# Patient Record
Sex: Male | Born: 2000
Health system: Southern US, Community
[De-identification: ages and names within clinical notes are randomized; demographics above are authoritative.]

## PROBLEM LIST (undated history)

## (undated) DIAGNOSIS — F988 Other specified behavioral and emotional disorders with onset usually occurring in childhood and adolescence: Secondary | ICD-10-CM

## (undated) HISTORY — PX: WISDOM TOOTH EXTRACTION: SHX21

---
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
## 2022-01-11 DIAGNOSIS — F900 Attention-deficit hyperactivity disorder, predominantly inattentive type: Secondary | ICD-10-CM | POA: Diagnosis not present

## 2022-04-05 DIAGNOSIS — J101 Influenza due to other identified influenza virus with other respiratory manifestations: Secondary | ICD-10-CM | POA: Diagnosis not present

## 2022-04-05 DIAGNOSIS — R0981 Nasal congestion: Secondary | ICD-10-CM | POA: Diagnosis not present

## 2022-04-05 DIAGNOSIS — R6883 Chills (without fever): Secondary | ICD-10-CM | POA: Diagnosis not present

## 2022-04-05 DIAGNOSIS — R059 Cough, unspecified: Secondary | ICD-10-CM | POA: Diagnosis not present

## 2022-05-12 DIAGNOSIS — Z7182 Exercise counseling: Secondary | ICD-10-CM | POA: Diagnosis not present

## 2022-05-12 DIAGNOSIS — S62658D Nondisplaced fracture of medial phalanx of other finger, subsequent encounter for fracture with routine healing: Secondary | ICD-10-CM | POA: Diagnosis not present

## 2022-05-12 DIAGNOSIS — Z68.41 Body mass index (BMI) pediatric, 5th percentile to less than 85th percentile for age: Secondary | ICD-10-CM | POA: Diagnosis not present

## 2022-05-28 DIAGNOSIS — M25641 Stiffness of right hand, not elsewhere classified: Secondary | ICD-10-CM | POA: Diagnosis not present

## 2022-05-28 DIAGNOSIS — M79644 Pain in right finger(s): Secondary | ICD-10-CM | POA: Diagnosis not present

## 2022-07-20 DIAGNOSIS — M79644 Pain in right finger(s): Secondary | ICD-10-CM | POA: Diagnosis not present

## 2023-04-07 ENCOUNTER — Other Ambulatory Visit (HOSPITAL_COMMUNITY): Payer: Self-pay

## 2023-04-07 MED ORDER — TRIAMCINOLONE ACETONIDE 0.1 % EX OINT
TOPICAL_OINTMENT | CUTANEOUS | 2 refills | Status: AC
  Filled 2023-04-07: qty 30, 15d supply, fill #0

## 2023-04-07 MED ORDER — HYDROCORTISONE 2.5 % EX OINT
1.0000 | TOPICAL_OINTMENT | Freq: Two times a day (BID) | CUTANEOUS | 2 refills | Status: AC
  Filled 2023-04-07: qty 28.35, 15d supply, fill #0

## 2023-08-24 ENCOUNTER — Other Ambulatory Visit (HOSPITAL_COMMUNITY): Payer: Self-pay

## 2023-08-24 ENCOUNTER — Ambulatory Visit (INDEPENDENT_AMBULATORY_CARE_PROVIDER_SITE_OTHER): Payer: PRIVATE HEALTH INSURANCE

## 2023-08-24 ENCOUNTER — Encounter: Payer: Self-pay | Admitting: Family Medicine

## 2023-08-24 ENCOUNTER — Ambulatory Visit: Payer: PRIVATE HEALTH INSURANCE | Admitting: Family Medicine

## 2023-08-24 ENCOUNTER — Ambulatory Visit: Payer: Self-pay | Admitting: Family Medicine

## 2023-08-24 VITALS — BP 120/82 | HR 75 | Ht 67.0 in | Wt 158.0 lb

## 2023-08-24 DIAGNOSIS — M542 Cervicalgia: Secondary | ICD-10-CM

## 2023-08-24 DIAGNOSIS — S161XXA Strain of muscle, fascia and tendon at neck level, initial encounter: Secondary | ICD-10-CM

## 2023-08-24 MED ORDER — TIZANIDINE HCL 2 MG PO TABS
2.0000 mg | ORAL_TABLET | Freq: Every day | ORAL | 0 refills | Status: AC
  Filled 2023-08-24: qty 30, 30d supply, fill #0

## 2023-08-24 MED ORDER — PREDNISONE 20 MG PO TABS
40.0000 mg | ORAL_TABLET | Freq: Every day | ORAL | 0 refills | Status: AC
  Filled 2023-08-24: qty 10, 5d supply, fill #0

## 2023-08-24 NOTE — Progress Notes (Signed)
 [name] [address] [place] [address] Phone: [phone] Subjective:   Daniel Brooks, am serving as a scribe for Dr. Ronnell Coins.  I'm seeing this patient by the request  of:  Daniel Forth, MD  CC: Acute onset neck pain  ION:[id number]  Daniel Brooks is a 23 y.o. male coming in with complaint of neck pain. Gets pinched nerve in neck intermittently but is occurring more frequently. Has some pain in L arm when neck hurt but this occurred only once. Able to be active despite the pain.  States if he lays on his back it seems better than laying on his side     Past Medical History:  Diagnosis Date   ADD (attention deficit disorder)    Past Surgical History:  Procedure Laterality Date   WISDOM TOOTH EXTRACTION     Social History   Socioeconomic History   Marital status: Single    Spouse name: Not on file   Number of children: Not on file   Years of education: Not on file   Highest education level: Not on file  Occupational History   Not on file  Tobacco Use   Smoking status: Never   Smokeless tobacco: Never  Vaping Use   Vaping status: Never Used  Substance and Sexual Activity   Alcohol use: Not on file   Drug use: Not on file   Sexual activity: Not Currently  Other Topics Concern   Not on file  Social History Narrative   Not on file   Social Drivers of Health   Financial Resource Strain: Not on file  Food Insecurity: Not on file  Transportation Needs: Not on file  Physical Activity: Not on file  Stress: Not on file  Social Connections: Not on file   No Known Allergies Family History  Problem Relation Age of Onset   Healthy Mother    Hyperlipidemia Father     Current Outpatient Medications (Endocrine & Metabolic):    predniSONE (DELTASONE) 20 MG tablet, Take 2 tablets (40 mg total) by mouth daily with breakfast.      Current Outpatient Medications (Other):    hydrocortisone  2.5 % ointment, Apply a small  amount to skin on eyelids twice a day for 1-2 weeks   methylphenidate  (CONCERTA ) 36 MG PO CR tablet, Take 1 tablet by mouth in the morning   methylphenidate  (CONCERTA ) 36 MG PO CR tablet, Take 1 tablet by mouth in the morning daily   methylphenidate  36 MG PO CR tablet, TAKE 1 TABLET BY MOUTH ONCE DAILY   methylphenidate  36 MG PO CR tablet, TAKE 1 TABLET BY MOUTH EVERY MORNING   mupirocin  ointment (BACTROBAN ) 2 %, Apply into the nostril using a q-tip 2 times a day for 10 days   penicillin  v potassium (VEETID) 500 MG tablet, Take 1 tablet by mouth twice daily.   tiZANidine (ZANAFLEX) 2 MG tablet, Take 1 tablet (2 mg total) by mouth at bedtime.   triamcinolone  ointment (KENALOG ) 0.1 %, Apply a small amount to skin on neck and ears twice daily for 1-2 weeks   methylphenidate  18 MG PO CR tablet, Take 1 tablet (18 mg total) by mouth every morning. (with 27mg  tab for a total daily dose of 45mg )   methylphenidate  27 MG PO CR tablet, Take 1 tablet (27 mg total) by mouth every morning. (with 18 mg tablet for a total daily dose of 45 mg)   Reviewed prior external information including notes and  imaging from  primary care provider As well as notes that were available from care everywhere and other healthcare systems.  Past medical history, social, surgical and family history all reviewed in electronic medical record.  No pertanent information unless stated regarding to the chief complaint.   Review of Systems:  No headache, visual changes, nausea, vomiting, diarrhea, constipation, dizziness, abdominal pain, skin rash, fevers, chills, night sweats, weight loss, swollen lymph nodes, body aches, joint swelling, chest pain, shortness of breath, mood changes. POSITIVE muscle aches  Objective  Blood pressure 120/82, pulse 75, height 5' 7 (1.702 m), weight 158 lb (71.7 kg), SpO2 98%.   General: No apparent distress alert and oriented x3 mood and affect normal, dressed appropriately.  HEENT: Pupils equal,  extraocular movements intact  Respiratory: Patient's speak in full sentences and does not appear short of breath  Cardiovascular: No lower extremity edema, non tender, no erythema  Does have some loss of lordosis noted.  Significant stiffness with flexion.  Negative Spurling's noted.  5 out of 5 strength of the upper extremities.  Deep tendon reflexes of the upper extremities intact   16109; 15 additional minutes spent for Therapeutic exercises as stated in above notes.  This included exercises focusing on stretching, strengthening, with significant focus on eccentric aspects.   Long term goals include an improvement in range of motion, strength, endurance as well as avoiding reinjury. Patient's frequency would include in 1-2 times a day, 3-5 times a week for a duration of 6-12 weeks. Shoulder Exercises that included:  Basic scapular stabilization to include adduction and depression of scapula Scaption, focusing on proper movement and good control Internal and External rotation utilizing a theraband, with elbow tucked at side entire time Rows with theraband    Proper technique shown and discussed handout in great detail with ATC.  All questions were discussed and answered.     Impression and Recommendations:    The above documentation has been reviewed and is accurate and complete [name] M [name], DO

## 2023-08-24 NOTE — Patient Instructions (Signed)
 Xray today Scapular exercises Prednisone 40mg  for 5 days start today or tmrw Zanaflex 2mg  at night Ice 20 min every 4 hours Can lift Monday but start 50% and increase 25% a week See me on July 3rd

## 2023-08-24 NOTE — Assessment & Plan Note (Signed)
 Seems more like a neck strain but seems to be at the posterior scalene and in the left upper scapula.  Worsening pain with flexion rotation to the left and sidebending to the right.  Discussed icing regimen and home exercises, discussed which activities to do and which ones to avoid.  Given some home exercises.  Prednisone and Zanaflex prescribed to help with his acute pain.  Hopefully will make a difference.  Discussed potentially trying to manipulation but with patient having significant tightness and some voluntary guarding difficult to assess completely.  Follow-up again in 6 to 8 weeks.  Increase activity slowly.  Follow-up again in 6 to 8 weeks

## 2023-09-02 NOTE — Progress Notes (Unsigned)
 [facility] [address] [place] [address] Phone: [phone] Subjective:   Daniel Brooks, am serving as a scribe for Dr. Arthea Claudene.  I'm seeing this patient by the request  of:  Nori Rogue, MD  CC: Neck and back pain follow-up  YEP:Dlagzrupcz  08/24/2023 Seems more like a neck strain but seems to be at the posterior scalene and in the left upper scapula.  Worsening pain with flexion rotation to the left and sidebending to the right.  Discussed icing regimen and home exercises, discussed which activities to do and which ones to avoid.  Given some home exercises.  Prednisone  and Zanaflex  prescribed to help with his acute pain.  Hopefully will make a difference.  Discussed potentially trying to manipulation but with patient having significant tightness and some voluntary guarding difficult to assess completely.  Follow-up again in 6 to 8 weeks.  Increase activity slowly.  Follow-up again in 6 to 8 weeks      Update 09/08/2023 Daniel Brooks is a 23 y.o. male coming in with complaint of neck strain. Patient states doing well. No noticeable pain. No new symptoms.      Past Medical History:  Diagnosis Date   ADD (attention deficit disorder)    Past Surgical History:  Procedure Laterality Date   WISDOM TOOTH EXTRACTION     Social History   Socioeconomic History   Marital status: Single    Spouse name: Not on file   Number of children: Not on file   Years of education: Not on file   Highest education level: Not on file  Occupational History   Not on file  Tobacco Use   Smoking status: Never   Smokeless tobacco: Never  Vaping Use   Vaping status: Never Used  Substance and Sexual Activity   Alcohol use: Not on file   Drug use: Not on file   Sexual activity: Not Currently  Other Topics Concern   Not on file  Social History Narrative   Not on file   Social Drivers of Health   Financial Resource Strain: Not on file  Food  Insecurity: Not on file  Transportation Needs: Not on file  Physical Activity: Not on file  Stress: Not on file  Social Connections: Not on file   No Known Allergies Family History  Problem Relation Age of Onset   Healthy Mother    Hyperlipidemia Father     Current Outpatient Medications (Endocrine & Metabolic):    predniSONE  (DELTASONE ) 20 MG tablet, Take 2 tablets (40 mg total) by mouth daily with breakfast.      Current Outpatient Medications (Other):    hydrocortisone  2.5 % ointment, Apply a small amount to skin on eyelids twice a day for 1-2 weeks   methylphenidate  (CONCERTA ) 36 MG PO CR tablet, Take 1 tablet by mouth in the morning   methylphenidate  (CONCERTA ) 36 MG PO CR tablet, Take 1 tablet by mouth in the morning daily   methylphenidate  18 MG PO CR tablet, Take 1 tablet (18 mg total) by mouth every morning. (with 27mg  tab for a total daily dose of 45mg )   methylphenidate  27 MG PO CR tablet, Take 1 tablet (27 mg total) by mouth every morning. (with 18 mg tablet for a total daily dose of 45 mg)   methylphenidate  36 MG PO CR tablet, TAKE 1 TABLET BY MOUTH ONCE DAILY   methylphenidate  36 MG PO CR tablet, TAKE 1 TABLET BY MOUTH EVERY MORNING  mupirocin  ointment (BACTROBAN ) 2 %, Apply into the nostril using a q-tip 2 times a day for 10 days   penicillin  v potassium (VEETID) 500 MG tablet, Take 1 tablet by mouth twice daily.   tiZANidine  (ZANAFLEX ) 2 MG tablet, Take 1 tablet (2 mg total) by mouth at bedtime.   triamcinolone  ointment (KENALOG ) 0.1 %, Apply a small amount to skin on neck and ears twice daily for 1-2 weeks    Objective  Blood pressure 120/74, pulse (!) 55, height 5' 7 (1.702 m), weight 157 lb (71.2 kg), SpO2 98%.   General: No apparent distress alert and oriented x3 mood and affect normal, dressed appropriately.  HEENT: Pupils equal, extraocular movements intact  Respiratory: Patient's speak in full sentences and does not appear short of breath   Cardiovascular: No lower extremity edema, non tender, no erythema  Neck full range of motion.  5 out of 5 strength in the upper exam.  No pain at the moment.    Impression and Recommendations:     The above documentation has been reviewed and is accurate and complete Daniel Forge M Osias Resnick, DO

## 2023-09-08 ENCOUNTER — Ambulatory Visit: Payer: PRIVATE HEALTH INSURANCE | Admitting: Family Medicine

## 2023-09-08 VITALS — BP 120/74 | HR 55 | Ht 67.0 in | Wt 157.0 lb

## 2023-09-08 DIAGNOSIS — S161XXA Strain of muscle, fascia and tendon at neck level, initial encounter: Secondary | ICD-10-CM

## 2023-09-08 NOTE — Assessment & Plan Note (Signed)
 Completely resolved.  Follow-up as needed
